# Patient Record
Sex: Female | Born: 1975
Health system: Southern US, Community
[De-identification: ages and names within clinical notes are randomized; demographics above are authoritative.]

## PROBLEM LIST (undated history)

## (undated) DIAGNOSIS — T7840XA Allergy, unspecified, initial encounter: Secondary | ICD-10-CM

## (undated) HISTORY — DX: Allergy, unspecified, initial encounter: T78.40XA

---

## 1998-08-17 ENCOUNTER — Other Ambulatory Visit: Admission: RE | Admit: 1998-08-17 | Discharge: 1998-08-17 | Payer: Self-pay | Admitting: *Deleted

## 1999-08-25 ENCOUNTER — Other Ambulatory Visit: Admission: RE | Admit: 1999-08-25 | Discharge: 1999-08-25 | Payer: Self-pay | Admitting: Obstetrics and Gynecology

## 2000-08-24 ENCOUNTER — Other Ambulatory Visit: Admission: RE | Admit: 2000-08-24 | Discharge: 2000-08-24 | Payer: Self-pay | Admitting: Obstetrics and Gynecology

## 2001-09-07 ENCOUNTER — Other Ambulatory Visit: Admission: RE | Admit: 2001-09-07 | Discharge: 2001-09-07 | Payer: Self-pay | Admitting: Obstetrics and Gynecology

## 2002-09-03 ENCOUNTER — Other Ambulatory Visit: Admission: RE | Admit: 2002-09-03 | Discharge: 2002-09-03 | Payer: Self-pay | Admitting: Family Medicine

## 2003-11-10 ENCOUNTER — Other Ambulatory Visit: Admission: RE | Admit: 2003-11-10 | Discharge: 2003-11-10 | Payer: Self-pay | Admitting: Family Medicine

## 2005-04-14 ENCOUNTER — Other Ambulatory Visit: Admission: RE | Admit: 2005-04-14 | Discharge: 2005-04-14 | Payer: Self-pay | Admitting: Family Medicine

## 2006-08-21 ENCOUNTER — Other Ambulatory Visit: Admission: RE | Admit: 2006-08-21 | Discharge: 2006-08-21 | Payer: Self-pay | Admitting: Family Medicine

## 2007-09-14 ENCOUNTER — Other Ambulatory Visit: Admission: RE | Admit: 2007-09-14 | Discharge: 2007-09-14 | Payer: Self-pay | Admitting: Family Medicine

## 2008-09-29 DIAGNOSIS — D508 Other iron deficiency anemias: Secondary | ICD-10-CM | POA: Insufficient documentation

## 2009-01-29 DIAGNOSIS — B373 Candidiasis of vulva and vagina: Secondary | ICD-10-CM | POA: Insufficient documentation

## 2010-03-17 ENCOUNTER — Other Ambulatory Visit: Payer: Self-pay | Admitting: Family Medicine

## 2010-03-17 DIAGNOSIS — N898 Other specified noninflammatory disorders of vagina: Secondary | ICD-10-CM | POA: Insufficient documentation

## 2010-03-17 DIAGNOSIS — Z1231 Encounter for screening mammogram for malignant neoplasm of breast: Secondary | ICD-10-CM

## 2010-03-23 DIAGNOSIS — E559 Vitamin D deficiency, unspecified: Secondary | ICD-10-CM | POA: Insufficient documentation

## 2010-03-24 ENCOUNTER — Ambulatory Visit: Payer: Self-pay

## 2011-02-21 ENCOUNTER — Other Ambulatory Visit (HOSPITAL_COMMUNITY)
Admission: RE | Admit: 2011-02-21 | Discharge: 2011-02-21 | Disposition: A | Discharge status: Home / Self Care | Payer: BC Managed Care – PPO | Source: Ambulatory Visit | Attending: Family Medicine | Admitting: Family Medicine

## 2011-02-21 DIAGNOSIS — Z01419 Encounter for gynecological examination (general) (routine) without abnormal findings: Secondary | ICD-10-CM | POA: Insufficient documentation

## 2012-03-15 ENCOUNTER — Ambulatory Visit (INDEPENDENT_AMBULATORY_CARE_PROVIDER_SITE_OTHER): Payer: BC Managed Care – PPO | Admitting: Physician Assistant

## 2012-03-15 VITALS — BP 106/52 | HR 78 | Temp 98.1°F | Resp 16 | Ht 61.0 in | Wt 145.4 lb

## 2012-03-15 DIAGNOSIS — J111 Influenza due to unidentified influenza virus with other respiratory manifestations: Secondary | ICD-10-CM

## 2012-03-15 DIAGNOSIS — J101 Influenza due to other identified influenza virus with other respiratory manifestations: Secondary | ICD-10-CM

## 2012-03-15 DIAGNOSIS — R059 Cough, unspecified: Secondary | ICD-10-CM

## 2012-03-15 DIAGNOSIS — R509 Fever, unspecified: Secondary | ICD-10-CM

## 2012-03-15 DIAGNOSIS — Z20818 Contact with and (suspected) exposure to other bacterial communicable diseases: Secondary | ICD-10-CM

## 2012-03-15 DIAGNOSIS — Z2089 Contact with and (suspected) exposure to other communicable diseases: Secondary | ICD-10-CM

## 2012-03-15 LAB — POCT CBC
Lymph, poc: 1.1 (ref 0.6–3.4)
MCH, POC: 27.4 pg (ref 27–31.2)
MCHC: 31.1 g/dL — AB (ref 31.8–35.4)
MID (cbc): 0.3 (ref 0–0.9)
MPV: 8.8 fL (ref 0–99.8)
POC Granulocyte: 1.1 — AB (ref 2–6.9)
POC LYMPH PERCENT: 44.2 %L (ref 10–50)
POC MID %: 12.9 %M — AB (ref 0–12)
RDW, POC: 14.4 %
WBC: 2.5 10*3/uL — AB (ref 4.6–10.2)

## 2012-03-15 LAB — POCT INFLUENZA A/B: Influenza A, POC: NEGATIVE

## 2012-03-15 MED ORDER — BENZONATATE 100 MG PO CAPS: 100.0000 mg | ORAL_CAPSULE | Freq: Three times a day (TID) | ORAL | Status: DC | PRN

## 2012-03-15 MED ORDER — HYDROCODONE-HOMATROPINE 5-1.5 MG/5ML PO SYRP: 5.0000 mL | ORAL_SOLUTION | Freq: Three times a day (TID) | ORAL | Status: DC | PRN

## 2012-03-15 NOTE — Patient Instructions (Addendum)
Tessalon for cough during the day.  Hycodan for cough at night if needed - this will make you sleepy, so only use at night.  Continue Advil/Tylenol if needed for fever/body aches.  Plenty of fluids and rest.  Symptoms should be resolving in the next 3-4 days though the cough may linger.  Please let me know if you feel like you are worsening or not improving.   Influenza Facts Flu (influenza) is a contagious respiratory illness caused by the influenza viruses. It can cause mild to severe illness. While most healthy people recover from the flu without specific treatment and without complications, older people, young children, and people with certain health conditions are at higher risk for serious complications from the flu, including death. CAUSES   The flu virus is spread from person to person by respiratory droplets from coughing and sneezing.  A person can also become infected by touching an object or surface with a virus on it and then touching their mouth, eye or nose.  Adults may be able to infect others from 1 day before symptoms occur and up to 7 days after getting sick. So it is possible to give someone the flu even before you know you are sick and continue to infect others while you are sick. SYMPTOMS   Fever (usually high).  Headache.  Tiredness (can be extreme).  Cough.  Sore throat.  Runny or stuffy nose.  Body aches.  Diarrhea and vomiting may also occur, particularly in children.  These symptoms are referred to as "flu-like symptoms". A lot of different illnesses, including the common cold, can have similar symptoms. DIAGNOSIS   There are tests that can determine if you have the flu as long you are tested within the first 2 or 3 days of illness.  A doctor's exam and additional tests may be needed to identify if you have a disease that is a complicating the flu. RISKS AND COMPLICATIONS  Some of the complications caused by the flu include:  Bacterial pneumonia or  progressive pneumonia caused by the flu virus.  Loss of body fluids (dehydration).  Worsening of chronic medical conditions, such as heart failure, asthma, or diabetes.  Sinus problems and ear infections. HOME CARE INSTRUCTIONS   Seek medical care early on.  If you are at high risk from complications of the flu, consult your health-care Kemora Pinard as soon as you develop flu-like symptoms. Those at high risk for complications include:  People 65 years or older.  People with chronic medical conditions, including diabetes.  Pregnant women.  Young children.  Your caregiver may recommend use of an antiviral medication to help treat the flu.  If you get the flu, get plenty of rest, drink a lot of liquids, and avoid using alcohol and tobacco.  You can take over-the-counter medications to relieve the symptoms of the flu if your caregiver approves. (Never give aspirin to children or teenagers who have flu-like symptoms, particularly fever). PREVENTION  The single best way to prevent the flu is to get a flu vaccine each fall. Other measures that can help protect against the flu are:  Antiviral Medications  A number of antiviral drugs are approved for use in preventing the flu. These are prescription medications, and a doctor should be consulted before they are used.  Habits for Good Health  Cover your nose and mouth with a tissue when you cough or sneeze, throw the tissue away after you use it.  Wash your hands often with soap and water,  especially after you cough or sneeze. If you are not near water, use an alcohol-based hand cleaner.  Avoid people who are sick.  If you get the flu, stay home from work or school. Avoid contact with other people so that you do not make them sick, too.  Try not to touch your eyes, nose, or mouth as germs ore often spread this way. IN CHILDREN, EMERGENCY WARNING SIGNS THAT NEED URGENT MEDICAL ATTENTION:  Fast breathing or trouble breathing.  Bluish  skin color.  Not drinking enough fluids.  Not waking up or not interacting.  Being so irritable that the child does not want to be held.  Flu-like symptoms improve but then return with fever and worse cough.  Fever with a rash. IN ADULTS, EMERGENCY WARNING SIGNS THAT NEED URGENT MEDICAL ATTENTION:  Difficulty breathing or shortness of breath.  Pain or pressure in the chest or abdomen.  Sudden dizziness.  Confusion.  Severe or persistent vomiting. SEEK IMMEDIATE MEDICAL CARE IF:  You or someone you know is experiencing any of the symptoms above. When you arrive at the emergency center,report that you think you have the flu. You may be asked to wear a mask and/or sit in a secluded area to protect others from getting sick. MAKE SURE YOU:   Understand these instructions.  Monitor your condition.  Seek medical care if you are getting worse, or not improving. Document Released: 12/23/2002 Document Revised: 03/14/2011 Document Reviewed: 09/18/2008 [facility] Patient Information 2013 [place], [place].

## 2012-03-15 NOTE — Progress Notes (Signed)
Subjective:    Patient ID: Shawna Hudson, female    DOB: [birth year], 37 y.o.   MRN: [id number]  HPI   [name] is a very pleasant 37 yr old female here with concern for illness.  Has been feeling bad for 5 days now.  Main complaint is a non-productive cough.  Also with an irritated throat, HA, fever to 101F.  Denies sinus/nasal symptoms.  Denies GI symptoms.  Does endorse body aches and muscle aches on ROS.  States "I kind of feel like I'm getting better, but the cough's not going away."  Has been using Advil and otc cough syrup for symptoms.  No flu shot this year.  Boyfriend's son was with her this past weekend and subsequently diagnosed with strep.    Review of Systems  Constitutional: Positive for fever and appetite change (decreased). Negative for chills.  Respiratory: Positive for cough. Negative for shortness of breath and wheezing.   Cardiovascular: Negative.   Gastrointestinal: Negative.   Musculoskeletal: Positive for myalgias and arthralgias.  Skin: Negative.   Neurological: Positive for headaches.       Objective:   Physical Exam  Vitals reviewed. Constitutional: She is oriented to person, place, and time. She appears well-developed and well-nourished. No distress.  HENT:  Head: Normocephalic and atraumatic.  Right Ear: Tympanic membrane and ear canal normal.  Left Ear: Tympanic membrane and ear canal normal.  Nose: Nose normal. Right sinus exhibits no maxillary sinus tenderness and no frontal sinus tenderness. Left sinus exhibits no maxillary sinus tenderness and no frontal sinus tenderness.  Mouth/Throat: Uvula is midline, oropharynx is clear and moist and mucous membranes are normal.  Eyes: Conjunctivae are normal. No scleral icterus.  Neck: Neck supple.  Cardiovascular: Normal rate, regular rhythm and normal heart sounds.  Exam reveals no gallop and no friction rub.   No murmur heard. Pulmonary/Chest: Effort normal and breath sounds normal. She has no wheezes. She  has no rales.  Lymphadenopathy:    She has no cervical adenopathy.  Neurological: She is alert and oriented to person, place, and time.  Skin: Skin is warm and dry.  Psychiatric: She has a normal mood and affect. Her behavior is normal.     Filed Vitals:   03/15/12 0940  BP: 106/52  Pulse: 78  Temp: 98.1 F (36.7 C)  Resp: 16     Results for orders placed in visit on 03/15/12  POCT CBC      Result Value Range   WBC 2.5 (*) 4.6 - 10.2 K/uL   Lymph, poc 1.1  0.6 - 3.4   POC LYMPH PERCENT 44.2  10 - 50 %L   MID (cbc) 0.3  0 - 0.9   POC MID % 12.9 (*) 0 - 12 %M   POC Granulocyte 1.1 (*) 2 - 6.9   Granulocyte percent 42.9  37 - 80 %G   RBC 4.46  4.04 - 5.48 M/uL   Hemoglobin 12.2  12.2 - 16.2 g/dL   HCT, POC 40.9  81.1 - 47.9 %   MCV 87.9  80 - 97 fL   MCH, POC 27.4  27 - 31.2 pg   MCHC 31.1 (*) 31.8 - 35.4 g/dL   RDW, POC 91.4     Platelet Count, POC 193  142 - 424 K/uL   MPV 8.8  0 - 99.8 fL  POCT INFLUENZA A/B      Result Value Range   Influenza A, POC Negative     Influenza B,  POC Positive    POCT RAPID STREP A (OFFICE)      Result Value Range   Rapid Strep A Screen Negative  Negative        Assessment & Plan:  Influenza B - Plan: benzonatate (TESSALON) 100 MG capsule, HYDROcodone-homatropine (HYCODAN) 5-1.5 MG/5ML syrup  Cough - Plan: POCT CBC, POCT Influenza A/B, POCT rapid strep A, benzonatate (TESSALON) 100 MG capsule, HYDROcodone-homatropine (HYCODAN) 5-1.5 MG/5ML syrup  Fever, unspecified - Plan: POCT CBC, POCT Influenza A/B, POCT rapid strep A  Exposure to strep throat - Plan: POCT CBC, POCT Influenza A/B, POCT rapid strep A   [name] is a very pleasant 37 yr old female with influenza B.  Rapid strep negative.  CBC with leukopenia, suspect secondary to influenza infection.  Unfortunately pt is outside the window where antivirals will be effective.  Will treat cough with Tessalon and Hycodan.  Advil/Tylenol for fever, body aches.  Anticipate that pt  will be feeling better in the next 3-4 days, though discussed with her that cough may linger.  Encouraged flu shot next season.  Work note provided.

## 2012-03-20 ENCOUNTER — Other Ambulatory Visit (HOSPITAL_COMMUNITY)
Admission: RE | Admit: 2012-03-20 | Discharge: 2012-03-20 | Disposition: A | Discharge status: Home / Self Care | Payer: BC Managed Care – PPO | Source: Ambulatory Visit | Attending: Family Medicine | Admitting: Family Medicine

## 2012-03-20 ENCOUNTER — Other Ambulatory Visit: Payer: Self-pay | Admitting: Family Medicine

## 2012-03-20 DIAGNOSIS — Z113 Encounter for screening for infections with a predominantly sexual mode of transmission: Secondary | ICD-10-CM | POA: Insufficient documentation

## 2012-03-20 DIAGNOSIS — Z1151 Encounter for screening for human papillomavirus (HPV): Secondary | ICD-10-CM | POA: Insufficient documentation

## 2012-03-20 DIAGNOSIS — Z124 Encounter for screening for malignant neoplasm of cervix: Secondary | ICD-10-CM | POA: Insufficient documentation

## 2014-02-19 ENCOUNTER — Other Ambulatory Visit (HOSPITAL_COMMUNITY)
Admission: RE | Admit: 2014-02-19 | Discharge: 2014-02-19 | Disposition: A | Discharge status: Home / Self Care | Payer: 59 | Source: Ambulatory Visit | Attending: Family Medicine | Admitting: Family Medicine

## 2014-02-19 ENCOUNTER — Other Ambulatory Visit: Payer: Self-pay | Admitting: Family Medicine

## 2014-02-19 DIAGNOSIS — R87810 Cervical high risk human papillomavirus (HPV) DNA test positive: Secondary | ICD-10-CM | POA: Insufficient documentation

## 2014-02-19 DIAGNOSIS — Z01419 Encounter for gynecological examination (general) (routine) without abnormal findings: Secondary | ICD-10-CM | POA: Diagnosis not present

## 2014-02-19 DIAGNOSIS — Z1151 Encounter for screening for human papillomavirus (HPV): Secondary | ICD-10-CM | POA: Diagnosis present

## 2014-02-21 LAB — CYTOLOGY - PAP

## 2015-09-18 ENCOUNTER — Other Ambulatory Visit: Payer: Self-pay | Admitting: Family Medicine

## 2015-09-18 DIAGNOSIS — Z1231 Encounter for screening mammogram for malignant neoplasm of breast: Secondary | ICD-10-CM

## 2015-09-28 ENCOUNTER — Ambulatory Visit
Admission: RE | Admit: 2015-09-28 | Discharge: 2015-09-28 | Disposition: A | Discharge status: Home / Self Care | Payer: 59 | Source: Ambulatory Visit | Attending: Family Medicine | Admitting: Family Medicine

## 2015-09-28 DIAGNOSIS — Z1231 Encounter for screening mammogram for malignant neoplasm of breast: Secondary | ICD-10-CM

## 2016-12-02 ENCOUNTER — Other Ambulatory Visit (HOSPITAL_COMMUNITY)
Admission: RE | Admit: 2016-12-02 | Discharge: 2016-12-02 | Disposition: A | Discharge status: Home / Self Care | Payer: 59 | Source: Ambulatory Visit | Attending: Family Medicine | Admitting: Family Medicine

## 2016-12-02 ENCOUNTER — Other Ambulatory Visit: Payer: Self-pay | Admitting: Family Medicine

## 2016-12-02 DIAGNOSIS — Z124 Encounter for screening for malignant neoplasm of cervix: Secondary | ICD-10-CM | POA: Diagnosis not present

## 2016-12-06 LAB — CYTOLOGY - PAP
DIAGNOSIS: NEGATIVE
HPV: DETECTED — AB

## 2017-01-24 ENCOUNTER — Other Ambulatory Visit: Payer: Self-pay | Admitting: Obstetrics and Gynecology

## 2017-08-07 ENCOUNTER — Encounter: Payer: Self-pay | Admitting: Sports Medicine

## 2017-08-07 ENCOUNTER — Ambulatory Visit (INDEPENDENT_AMBULATORY_CARE_PROVIDER_SITE_OTHER): Payer: 59 | Admitting: Sports Medicine

## 2017-08-07 ENCOUNTER — Ambulatory Visit (INDEPENDENT_AMBULATORY_CARE_PROVIDER_SITE_OTHER): Payer: 59

## 2017-08-07 DIAGNOSIS — M79672 Pain in left foot: Secondary | ICD-10-CM

## 2017-08-07 DIAGNOSIS — M779 Enthesopathy, unspecified: Secondary | ICD-10-CM

## 2017-08-07 DIAGNOSIS — M79671 Pain in right foot: Secondary | ICD-10-CM | POA: Diagnosis not present

## 2017-08-07 DIAGNOSIS — M21619 Bunion of unspecified foot: Secondary | ICD-10-CM

## 2017-08-07 MED ORDER — MELOXICAM 15 MG PO TABS: 15.0000 mg | ORAL_TABLET | Freq: Every day | ORAL | 0 refills | Status: DC

## 2017-08-07 NOTE — Patient Instructions (Signed)

## 2017-08-07 NOTE — Progress Notes (Signed)
Subjective: Shawna Hudson is a 42 y.o. female patient who presents to office for evaluation of Right> Left bunion pain but the left seems to be crossing over or more painful today. Patient complains of progressive pain especially over the last year in the Right>Left foot that starts as pain over the bump with direct pressure and range of motion; patient now has difficulty fitting shoes comfortably. Ranks pain5/10 and is now interferring with daily activities.  Patient has also tried injections 2 years ago with a little relief. Patient denies any other pedal complaints.   Review of Systems  Musculoskeletal: Positive for joint pain.  All other systems reviewed and are negative.    Patient Active Problem List   Diagnosis Date Noted  . Vitamin D deficiency 03/23/2010  . Vaginal leukorrhea 03/17/2010  . Candidal vulvovaginitis 01/29/2009  . Iron deficiency anemia secondary to inadequate dietary iron intake 09/29/2008    Current Outpatient Medications on File Prior to Visit  Medication Sig Dispense Refill  . benzonatate (TESSALON) 100 MG capsule Take 1-2 capsules (100-200 mg total) by mouth 3 (three) times daily as needed for cough. 40 capsule 0  . HYDROcodone-homatropine (HYCODAN) 5-1.5 MG/5ML syrup Take 5 mLs by mouth every 8 (eight) hours as needed for cough. 120 mL 0   No current facility-administered medications on file prior to visit.     No Known Allergies  Objective:  General: Alert and oriented x3 in no acute distress  Dermatology: No open lesions bilateral lower extremities, no webspace macerations, no ecchymosis bilateral, all nails x 10 are well manicured.  Vascular: Dorsalis Pedis and Posterior Tibial pedal pulses 2/4, Capillary Fill Time 3 seconds, (+) pedal hair growth bilateral, no edema bilateral lower extremities, Temperature gradient within normal limits.  Neurology: Gross sensation intact via light touch bilateral. (-) Tinels sign right and left foot.    Musculoskeletal: Mild tenderness with palpation right>left bunion deformity, no limitation or crepitus with range of motion, deformity reducible, tracking not trackbound, there is no 1st ray hypermobility noted bilateral. Midtarsal, Subtalar joint, and ankle joint range of motion is within normal limits. On weightbearing exam, there is decreased 1st MTPJ rom Right>Left with functional limitus noted, there is medial arch collapse Right> Left on weightbearing, rearfoot slight valgus, forefoot slight abduction with HAV deformity supported on ground with no second toe crossover deformity noted.   Gait: Non-Antalgic gait with increased medial arch collapse and pronatory influence noted on Right> Left foot with medial 1st MTPJ roll-off at toe-off, heel off within normal limits.   Xrays  Right/Left Foot    Impression: Intermetatarsal angle above normal limits R>L supportive of bunion and midtarsal breech supportive of pes planus.        Assessment and Plan: Problem List Items Addressed This Visit    None    Visit Diagnoses    Bunion    -  Primary   Relevant Medications   meloxicam (MOBIC) 15 MG tablet   Other Relevant Orders   DG Foot Complete Right   DG Foot Complete Left   Capsulitis       Relevant Medications   meloxicam (MOBIC) 15 MG tablet   Foot pain, bilateral       Relevant Medications   meloxicam (MOBIC) 15 MG tablet       -Complete examination performed -Xrays reviewed -Discussed treatement options; discussed HAV deformity;conservative and  Surgical management; risks, benefits, alternatives discussed. All patient's questions answered. -Rx Mobic and Bunion shields -Recommend continue with good supportive shoes  and inserts.  -Patient to return to office as needed or sooner if condition worsens.Advised patient to consider injection vs surgery if continues to be painful.   Asencion Islam, DPM

## 2017-09-05 ENCOUNTER — Other Ambulatory Visit: Payer: Self-pay | Admitting: Sports Medicine

## 2017-09-05 DIAGNOSIS — M79672 Pain in left foot: Secondary | ICD-10-CM

## 2017-09-05 DIAGNOSIS — M779 Enthesopathy, unspecified: Secondary | ICD-10-CM

## 2017-09-05 DIAGNOSIS — M79671 Pain in right foot: Secondary | ICD-10-CM

## 2017-09-05 DIAGNOSIS — M21619 Bunion of unspecified foot: Secondary | ICD-10-CM

## 2017-10-31 ENCOUNTER — Encounter: Payer: Self-pay | Admitting: Sports Medicine

## 2017-10-31 ENCOUNTER — Ambulatory Visit (INDEPENDENT_AMBULATORY_CARE_PROVIDER_SITE_OTHER): Payer: 59 | Admitting: Sports Medicine

## 2017-10-31 DIAGNOSIS — M21619 Bunion of unspecified foot: Secondary | ICD-10-CM

## 2017-10-31 DIAGNOSIS — M79671 Pain in right foot: Secondary | ICD-10-CM

## 2017-10-31 DIAGNOSIS — M79672 Pain in left foot: Secondary | ICD-10-CM

## 2017-10-31 DIAGNOSIS — M779 Enthesopathy, unspecified: Secondary | ICD-10-CM

## 2017-10-31 MED ORDER — TRIAMCINOLONE ACETONIDE 10 MG/ML IJ SUSP: 10.0000 mg | Freq: Once | INTRAMUSCULAR | Status: AC

## 2017-10-31 NOTE — Progress Notes (Signed)
Subjective: Shawna Hudson is a 42 y.o. female patient who returns to office for evaluation of Right> Left bunion pain but the left seems to be increasingly getting more painful states that her bunion seem better not as bad as before but also states that she has not been working out as much and this may be hoping that she is resting patient states that she is not ready for surgery yet she just started a new job and cannot take off any time from work however did admit a history of previous steroid injections 2 years ago that seemed to help her bunions and it is interested at this visit and getting more injections.  Patient denies changes with medical history since last visit denies any new nausea vomiting fever chills or any other constitutional symptoms at this time.  Patient denies any other pedal complaints.     Patient Active Problem List   Diagnosis Date Noted  . Vitamin D deficiency 03/23/2010  . Vaginal leukorrhea 03/17/2010  . Candidal vulvovaginitis 01/29/2009  . Iron deficiency anemia secondary to inadequate dietary iron intake 09/29/2008    Current Outpatient Medications on File Prior to Visit  Medication Sig Dispense Refill  . meloxicam (MOBIC) 15 MG tablet TAKE 1 TABLET BY MOUTH EVERY DAY 30 tablet 0   No current facility-administered medications on file prior to visit.     No Known Allergies  Objective:  General: Alert and oriented x3 in no acute distress  Dermatology: No open lesions bilateral lower extremities, no webspace macerations, no ecchymosis bilateral, all nails x 10 are well manicured.  Vascular: Dorsalis Pedis and Posterior Tibial pedal pulses 2/4, Capillary Fill Time 3 seconds, (+) pedal hair growth bilateral, no edema bilateral lower extremities, Temperature gradient within normal limits.  Neurology: Gross sensation intact via light touch bilateral. (-) Tinels sign right and left foot.   Musculoskeletal: Mild tenderness with palpation right>left bunion  deformity, no limitation or crepitus with range of motion, deformity reducible, tracking not trackbound, there is no 1st ray hypermobility noted bilateral. Midtarsal, Subtalar joint, and ankle joint range of motion is within normal limits.   Assessment and Plan: Problem List Items Addressed This Visit    None    Visit Diagnoses    Capsulitis    -  Primary   Relevant Medications   triamcinolone acetonide (KENALOG) 10 MG/ML injection 10 mg   Bunion       Foot pain, bilateral           -Complete examination performed -Previous xrays reviewed -Re- Discussed treatement options; discussed HAV deformity;conservative and  Surgical management; risks, benefits, alternatives discussed. All patient's questions answered. -Patient declines any surgical intervention at this time -After oral consent and aseptic prep, injected a mixture containing 1 ml of 2%  plain lidocaine, 1 ml 0.5% plain marcaine, 0.5 ml of kenalog 10 and 0.5 ml of dexamethasone phosphate into right and left first metatarsophalangeal joints without complication. Post-injection care discussed with patient.  -Continue with Mobic and Bunion shields -Recommend continue with good supportive shoes and inserts.  -Patient to return to office as needed or sooner if condition worsens.  Asencion Islam, DPM

## 2018-03-23 ENCOUNTER — Other Ambulatory Visit: Payer: Self-pay | Admitting: Obstetrics and Gynecology

## 2018-03-23 ENCOUNTER — Other Ambulatory Visit (HOSPITAL_COMMUNITY)
Admission: RE | Admit: 2018-03-23 | Discharge: 2018-03-23 | Disposition: A | Discharge status: Home / Self Care | Payer: Managed Care, Other (non HMO) | Source: Ambulatory Visit | Attending: Obstetrics and Gynecology | Admitting: Obstetrics and Gynecology

## 2018-03-23 DIAGNOSIS — Z01411 Encounter for gynecological examination (general) (routine) with abnormal findings: Secondary | ICD-10-CM | POA: Insufficient documentation

## 2018-03-23 DIAGNOSIS — R8789 Other abnormal findings in specimens from female genital organs: Secondary | ICD-10-CM | POA: Insufficient documentation

## 2018-03-28 ENCOUNTER — Other Ambulatory Visit: Payer: Self-pay | Admitting: Sports Medicine

## 2018-03-28 DIAGNOSIS — M79672 Pain in left foot: Secondary | ICD-10-CM

## 2018-03-28 DIAGNOSIS — M21619 Bunion of unspecified foot: Secondary | ICD-10-CM

## 2018-03-28 DIAGNOSIS — M79671 Pain in right foot: Secondary | ICD-10-CM

## 2018-03-28 DIAGNOSIS — M779 Enthesopathy, unspecified: Secondary | ICD-10-CM

## 2018-03-28 LAB — CYTOLOGY - PAP
DIAGNOSIS: NEGATIVE
HPV (WINDOPATH): DETECTED — AB
HPV 16/18/45 GENOTYPING: NEGATIVE

## 2018-04-25 ENCOUNTER — Other Ambulatory Visit: Payer: Self-pay | Admitting: Sports Medicine

## 2018-04-25 DIAGNOSIS — M79672 Pain in left foot: Secondary | ICD-10-CM

## 2018-04-25 DIAGNOSIS — M21619 Bunion of unspecified foot: Secondary | ICD-10-CM

## 2018-04-25 DIAGNOSIS — M79671 Pain in right foot: Secondary | ICD-10-CM

## 2018-04-25 DIAGNOSIS — M779 Enthesopathy, unspecified: Secondary | ICD-10-CM

## 2018-10-26 ENCOUNTER — Other Ambulatory Visit: Payer: Self-pay | Admitting: Obstetrics and Gynecology

## 2018-10-26 DIAGNOSIS — Z1231 Encounter for screening mammogram for malignant neoplasm of breast: Secondary | ICD-10-CM

## 2018-12-19 ENCOUNTER — Ambulatory Visit
Admission: RE | Admit: 2018-12-19 | Discharge: 2018-12-19 | Disposition: A | Discharge status: Home / Self Care | Payer: Managed Care, Other (non HMO) | Source: Ambulatory Visit | Attending: Obstetrics and Gynecology | Admitting: Obstetrics and Gynecology

## 2018-12-19 ENCOUNTER — Other Ambulatory Visit: Payer: Self-pay

## 2018-12-19 DIAGNOSIS — Z1231 Encounter for screening mammogram for malignant neoplasm of breast: Secondary | ICD-10-CM

## 2019-05-09 ENCOUNTER — Ambulatory Visit: Payer: Managed Care, Other (non HMO) | Attending: Internal Medicine

## 2019-05-09 DIAGNOSIS — Z23 Encounter for immunization: Secondary | ICD-10-CM

## 2019-05-09 NOTE — Progress Notes (Signed)
   Covid-19 Vaccination Clinic  Name:  Shawna Hudson    MRN: [id number] DOB: 17-Nov-1975  05/09/2019  [name] was observed post Covid-19 immunization for 15 minutes without incident. She was provided with Vaccine Information Sheet and instruction to access the V-Safe system.   [name] was instructed to call 911 with any severe reactions post vaccine: Marland Kitchen Difficulty breathing  . Swelling of face and throat  . A fast heartbeat  . A bad rash all over body  . Dizziness and weakness   Immunizations Administered    Name Date Dose VIS Date Route   Pfizer COVID-19 Vaccine 05/09/2019  3:30 PM 0.3 mL 02/27/2018 Intramuscular   Manufacturer: [employer], [employer]   Lot: [id number]   NDC: [address]-8

## 2019-06-04 ENCOUNTER — Ambulatory Visit: Payer: Managed Care, Other (non HMO) | Attending: Internal Medicine

## 2019-06-04 DIAGNOSIS — Z23 Encounter for immunization: Secondary | ICD-10-CM

## 2019-06-04 NOTE — Progress Notes (Signed)
   Covid-19 Vaccination Clinic  Name:  Shawna Hudson    MRN: [id number] DOB: 13-Mar-1975  06/04/2019  [name] was observed post Covid-19 immunization for 15 minutes without incident. She was provided with Vaccine Information Sheet and instruction to access the V-Safe system.   [name] was instructed to call 911 with any severe reactions post vaccine: Marland Kitchen Difficulty breathing  . Swelling of face and throat  . A fast heartbeat  . A bad rash all over body  . Dizziness and weakness   Immunizations Administered    Name Date Dose VIS Date Route   Pfizer COVID-19 Vaccine 06/04/2019  4:00 PM 0.3 mL 02/27/2018 Intramuscular   Manufacturer: [employer], [employer]   Lot: [id number]   NDC: [address]-4

## 2020-06-01 IMAGING — MG DIGITAL SCREENING BILAT W/ CAD
4 series · 4 of 4 positions shown · non-contrast
Comparison: Previous exam(s).

CLINICAL DATA: Screening.

EXAM:
DIGITAL SCREENING BILATERAL MAMMOGRAM WITH CAD

[L CC]
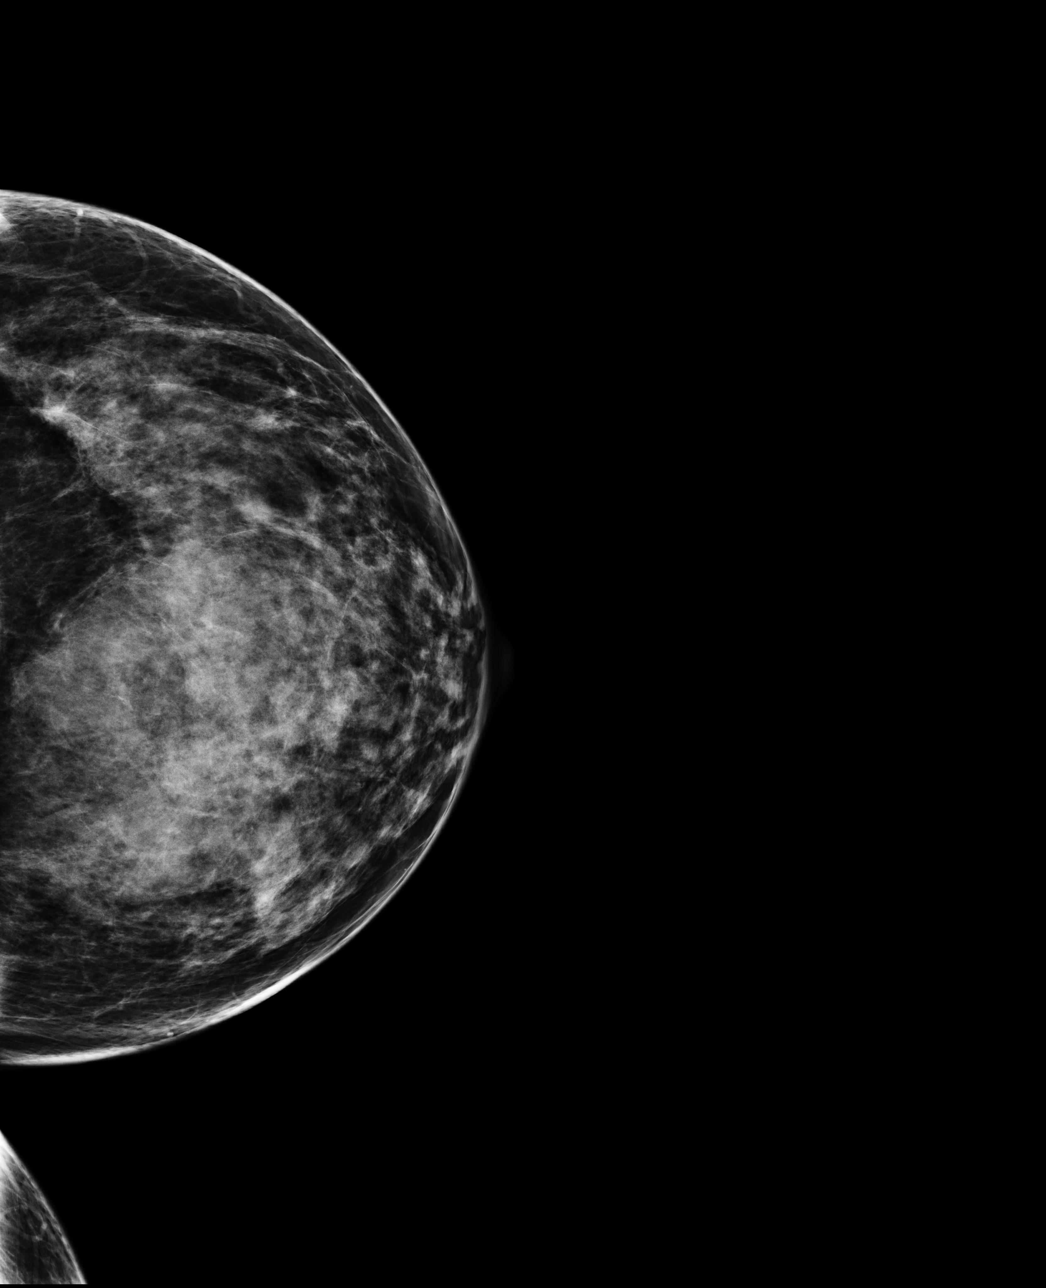

[R CC]
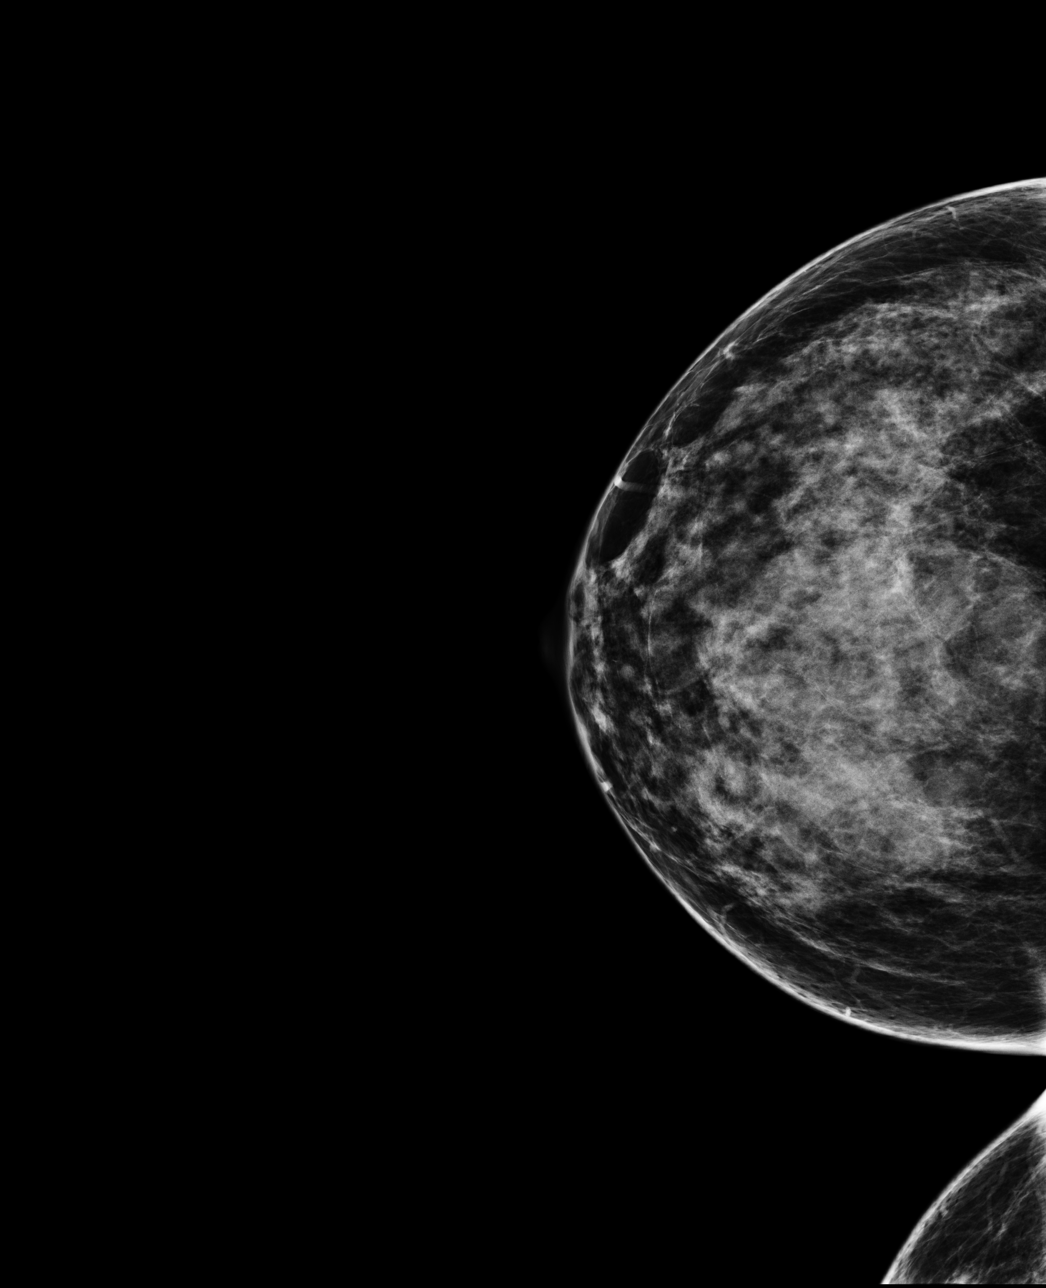

[L MLO]
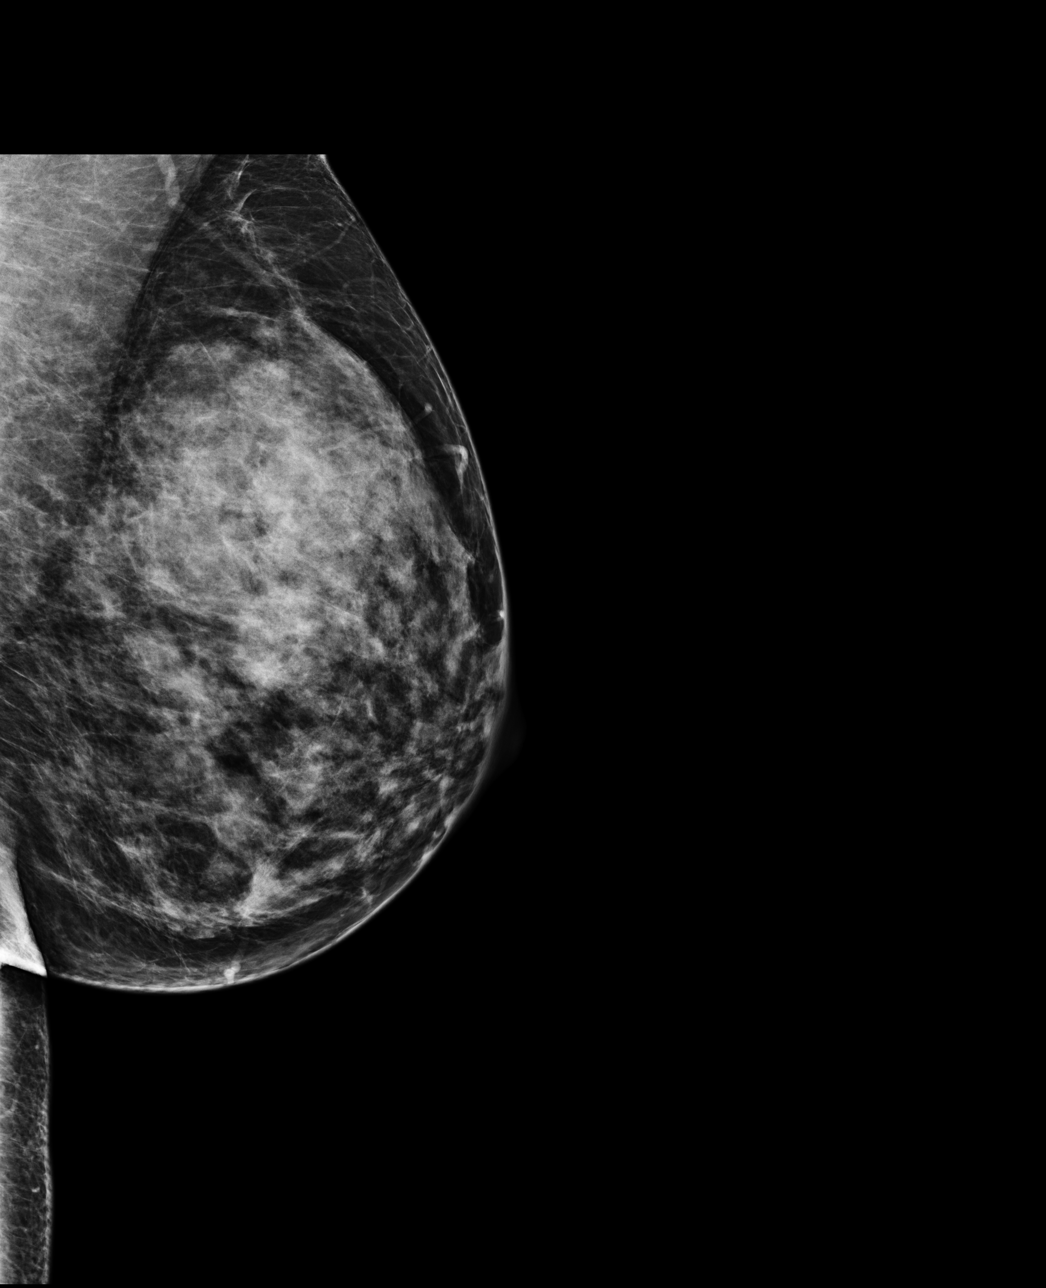

[R MLO]
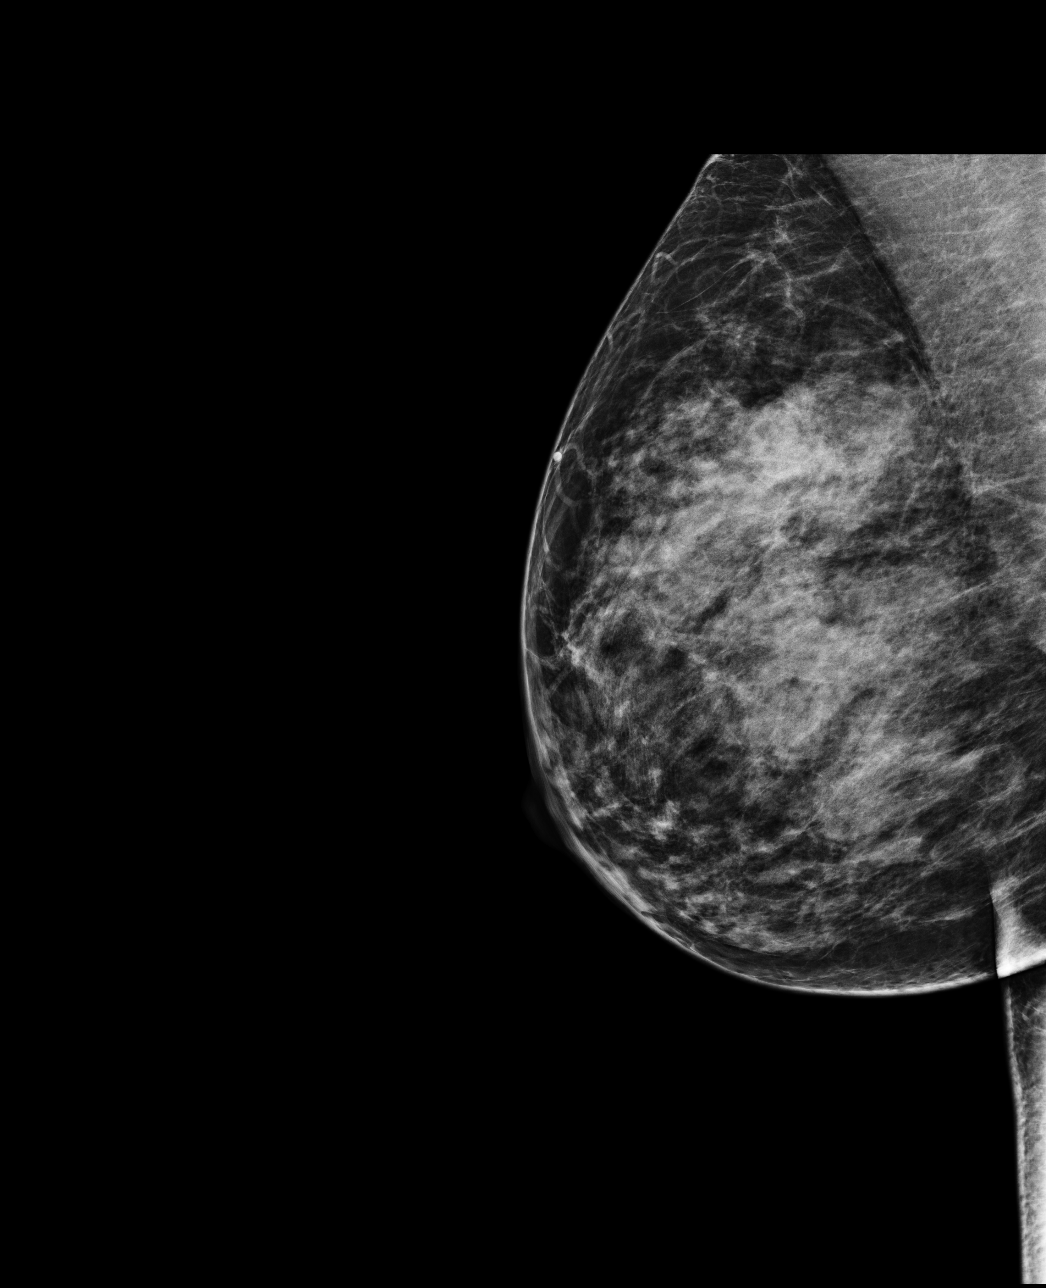

[4 of 4 positions shown; findings below may reference images not displayed]

ACR Breast Density Category d: The breast tissue is extremely dense,
which lowers the sensitivity of mammography
FINDINGS: There are no findings suspicious for malignancy. Images were
processed with CAD.
IMPRESSION: No mammographic evidence of malignancy. A result letter of this
screening mammogram will be mailed directly to the patient.

RECOMMENDATION:
Screening mammogram in one year. (Code:A5-2-HPS)

BI-RADS CATEGORY  1: Negative.

## 2021-05-06 ENCOUNTER — Other Ambulatory Visit: Payer: Self-pay | Admitting: Nurse Practitioner

## 2021-05-06 ENCOUNTER — Other Ambulatory Visit (HOSPITAL_COMMUNITY)
Admission: RE | Admit: 2021-05-06 | Discharge: 2021-05-06 | Disposition: A | Discharge status: Home / Self Care | Payer: 59 | Source: Ambulatory Visit | Attending: Nurse Practitioner | Admitting: Nurse Practitioner

## 2021-05-06 DIAGNOSIS — Z124 Encounter for screening for malignant neoplasm of cervix: Secondary | ICD-10-CM | POA: Insufficient documentation

## 2021-05-07 LAB — CYTOLOGY - PAP
Comment: NEGATIVE
Diagnosis: NEGATIVE
High risk HPV: NEGATIVE
